# Patient Record
Sex: Male | Born: 1995 | Race: Black or African American | Hispanic: Yes | Marital: Single | Smoking: Never smoker
Health system: Southern US, Community
[De-identification: ages and names within clinical notes are randomized; demographics above are authoritative.]

---

## 2020-07-10 ENCOUNTER — Other Ambulatory Visit: Payer: Self-pay

## 2020-07-10 ENCOUNTER — Ambulatory Visit
Admission: EM | Admit: 2020-07-10 | Discharge: 2020-07-10 | Disposition: A | Discharge status: Home / Self Care | Payer: Self-pay | Attending: Internal Medicine | Admitting: Internal Medicine

## 2020-07-10 ENCOUNTER — Encounter: Payer: Self-pay | Admitting: Emergency Medicine

## 2020-07-10 DIAGNOSIS — R300 Dysuria: Secondary | ICD-10-CM | POA: Insufficient documentation

## 2020-07-10 LAB — URINALYSIS, COMPLETE (UACMP) WITH MICROSCOPIC
Bacteria, UA: NONE SEEN
Bilirubin Urine: NEGATIVE
Glucose, UA: NEGATIVE mg/dL
Hgb urine dipstick: NEGATIVE
Ketones, ur: NEGATIVE mg/dL
Leukocytes,Ua: NEGATIVE
Nitrite: NEGATIVE
Protein, ur: NEGATIVE mg/dL
Specific Gravity, Urine: 1.02 (ref 1.005–1.030)
Squamous Epithelial / HPF: NONE SEEN (ref 0–5)
WBC, UA: NONE SEEN WBC/hpf (ref 0–5)
pH: 7.5 (ref 5.0–8.0)

## 2020-07-10 LAB — CHLAMYDIA/NGC RT PCR (ARMC ONLY)
Chlamydia Tr: NOT DETECTED
N gonorrhoeae: NOT DETECTED

## 2020-07-10 MED ORDER — CEFTRIAXONE SODIUM 500 MG IJ SOLR
500.0000 mg | Freq: Once | INTRAMUSCULAR | Status: AC
  Administered 2020-07-10: 500 mg via INTRAMUSCULAR

## 2020-07-10 MED ORDER — AZITHROMYCIN 500 MG PO TABS
1000.0000 mg | ORAL_TABLET | Freq: Every day | ORAL | Status: AC
  Administered 2020-07-10: 1000 mg via ORAL

## 2020-07-10 NOTE — Discharge Instructions (Signed)
STD TESTING: Routine cultures obtained.  You have been treated for gonorrhea and chlamydia in the clinic today.  You can access your results through MyChart.  They should be back later today. Stressed to avoid sexual activity until all tests are negative

## 2020-07-10 NOTE — ED Provider Notes (Signed)
[facility]    CSN: [id number] Arrival date & time: 07/10/20  1056      History   Chief Complaint Chief Complaint  Patient presents with  . Dysuria    HPI Angel Sanders is a 24 y.o. male.   Patient presents for dysuria off and on for the past 6 days.  He denies any other symptoms.  He denies fever, urethral discharge, testicular pain or swelling, rashes or skin lesions, pelvic pain abdominal pain, or back pain.  He denies any known exposure to any STIs.  He states he would like to be tested and treated for STIs today.  No other concerns.  HPI  History reviewed. No pertinent past medical history.  There are no problems to display for this patient.   History reviewed. No pertinent surgical history.     Home Medications    Prior to Admission medications   Not on File    Family History Family History  Problem Relation Age of Onset  . Healthy Mother     Social History Social History   Tobacco Use  . Smoking status: Never Smoker  . Smokeless tobacco: Never Used  Vaping Use  . Vaping Use: Never used  Substance Use Topics  . Alcohol use: Not Currently  . Drug use: Never     Allergies   Patient has no known allergies.   Review of Systems Review of Systems  Constitutional: Negative for fatigue and fever.  Gastrointestinal: Negative for abdominal pain, nausea and vomiting.  Genitourinary: Positive for dysuria. Negative for difficulty urinating, discharge, frequency, scrotal swelling and testicular pain.  Musculoskeletal: Negative for back pain.  Skin: Negative for rash.  Neurological: Negative for weakness.     Physical Exam Triage Vital Signs ED Triage Vitals  Enc Vitals Group     BP 07/10/20 1138 124/65     Pulse Rate 07/10/20 1138 75     Resp 07/10/20 1138 16     Temp 07/10/20 1138 98.2 F (36.8 C)     Temp Source 07/10/20 1138 Oral     SpO2 07/10/20 1138 100 %     Weight 07/10/20 1135 185 lb (83.9 kg)     Height 07/10/20  1135 6\' 4"  (1.93 m)     Head Circumference --      Peak Flow --      Pain Score 07/10/20 1135 0     Pain Loc --      Pain Edu? --      Excl. in GC? --    No data found.  Updated Vital Signs BP 124/65 (BP Location: Right Arm)   Pulse 75   Temp 98.2 F (36.8 C) (Oral)   Resp 16   Ht 6\' 4"  (1.93 m)   Wt 185 lb (83.9 kg)   SpO2 100%   BMI 22.52 kg/m       Physical Exam Vitals and nursing note reviewed.  Constitutional:      General: He is not in acute distress.    Appearance: Normal appearance. He is well-developed. He is not ill-appearing or toxic-appearing.  HENT:     Head: Normocephalic and atraumatic.  Eyes:     General: No scleral icterus.    Extraocular Movements: Extraocular movements intact.     Conjunctiva/sclera: Conjunctivae normal.     Pupils: Pupils are equal, round, and reactive to light.  Cardiovascular:     Rate and Rhythm: Normal rate and regular rhythm.     Heart sounds:  Normal heart sounds. No murmur heard.   Pulmonary:     Effort: Pulmonary effort is normal. No respiratory distress.     Breath sounds: Normal breath sounds.  Abdominal:     General: Bowel sounds are normal.     Palpations: Abdomen is soft.     Tenderness: There is no abdominal tenderness. There is no right CVA tenderness or left CVA tenderness.  Musculoskeletal:        General: No swelling, tenderness, deformity or signs of injury. Normal range of motion.     Cervical back: Normal range of motion and neck supple.  Skin:    General: Skin is warm and dry.     Findings: No rash.  Neurological:     General: No focal deficit present.     Mental Status: He is alert. Mental status is at baseline.     Motor: No weakness.     Coordination: Coordination normal.     Gait: Gait normal.  Psychiatric:        Mood and Affect: Mood normal.        Behavior: Behavior normal.        Thought Content: Thought content normal.      UC Treatments / Results  Labs (all labs ordered are listed,  but only abnormal results are displayed) Labs Reviewed  CHLAMYDIA/NGC RT PCR ([facility] ONLY)  URINALYSIS, COMPLETE ([facility]) WITH MICROSCOPIC    EKG   Radiology No results found.  Procedures Procedures (including critical care time)  Medications Ordered in UC Medications  cefTRIAXone (ROCEPHIN) injection 500 mg (has no administration in time range)  azithromycin (ZITHROMAX) tablet 1,000 mg (has no administration in time range)    Initial Impression / Assessment and Plan / UC Course  I have reviewed the triage vital signs and the nursing notes.  Pertinent labs & imaging results that were available during my care of the patient were reviewed by me and considered in my medical decision making (see chart for details).   24 year old male presenting for dysuria off and on for 6 days.  Urinalysis is normal.  Testing sent for gonorrhea and chlamydia.  Patient wanted to be treated for gonorrhea chlamydia.  Patient given 500 mg IM Rocephin in clinic as well as 1 g azithromycin p.o.  Discussed with patient had axis test results.  Advised to abstain from intercourse until testing has been received.  He should not have any intercourse for a week after treatment for STIs.  Follow-up as needed.  Final Clinical Impressions(s) / UC Diagnoses   Final diagnoses:  Dysuria   Discharge Instructions   None    ED Prescriptions    None     PDMP not reviewed this encounter.   Shirlee Latch, PA-C 07/10/20 1301

## 2020-07-10 NOTE — ED Triage Notes (Signed)
Patient c/o burning when urinating that started on Saturday.  Patient denies penile discharge.

## 2020-07-21 ENCOUNTER — Other Ambulatory Visit: Payer: Self-pay

## 2020-07-21 ENCOUNTER — Ambulatory Visit: Payer: BC Managed Care – PPO | Attending: Sports Medicine | Admitting: Physical Therapy

## 2020-07-21 ENCOUNTER — Encounter: Payer: Self-pay | Admitting: Physical Therapy

## 2020-07-21 DIAGNOSIS — M6281 Muscle weakness (generalized): Secondary | ICD-10-CM | POA: Insufficient documentation

## 2020-07-21 DIAGNOSIS — M25572 Pain in left ankle and joints of left foot: Secondary | ICD-10-CM | POA: Insufficient documentation

## 2020-07-21 NOTE — Therapy (Signed)
[facility] [address] [place], [place], [address] Phone: [phone]   Fax:  [phone]  Physical Therapy Evaluation  Patient Details  Name: UNDRAY ALLMAN MRN: [id number] Date of Birth: [birth year] Referring Provider (PT): Landry Mellow, Mervyn Skeeters   Encounter Date: 07/21/2020   PT End of Session - 07/21/20 0924    Visit Number 1    Number of Visits 9    Date for PT Re-Evaluation 08/18/20    PT Start Time 0805    PT Stop Time 0900    PT Time Calculation (min) 55 min    Activity Tolerance Patient tolerated treatment well    Behavior During Therapy [facility] for tasks assessed/performed           History reviewed. No pertinent past medical history.  History reviewed. No pertinent surgical history.  There were no vitals filed for this visit.      SUBJECTIVE Chief complaint: Patient feels pain along the posterior tibial tendon and the pain radiates up to the medial knee. 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 Patient notes that over the course of training this summer he had AM stiffness and soreness, but not to the level that it is now. Patient notes the pain increased to a high intensity about 3 weeks ago. Patient notes initially he was limping but after a few rest days he was able to walk without limping. Patient has been able to bike for cardio without pain/issue. Patient reports some level of fear with retrnign to running as he does not trust his L ankle.  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 No ecchymosis, erythema, or edema noted. No gross ankle/foot deformity noted  Lumbar/Hip/Knee Screen AROM: WFL and painless with overpressure in all planes  Posture No gross abnormalities noted in seated or standing posture.  Gait Decreased control of L foot during initial contact to stance phase. Audible foot contact on L.   Palpation Mild tenderness along the posterior tibial pathway at the medial malleolus. L achilles tendon substantially thicker than R commensurate with dominant jumping leg.  Strength R/L 5/5 Hip flexion 5/5 Hip external rotation 5/5 Hip internal rotation 5/5 Hip extension  4/5 Hip abduction 5/5 Hip adduction 5/5 Knee extension 5/5 Knee flexion 5/5 Ankle Plantarflexion (seated) 5/5 Ankle Dorsiflexion 5/4* Ankle Inversion 5/4 Ankle Eversion  PROM R/L B WNL Ankle Plantarflexion B WNL Ankle Dorsiflexion B WNL Ankle Inversion B WNL Ankle Eversion  Passive Accessory Motion Superior Tibiofibular Joint: WNL, B Inferior Tibiofibular Joint: WNL, B Talocrural Joint Distraction: WNL, B Talocrural Joint AP: WNL, B Talocrural Joint PA: WNL, B  NEUROLOGICAL:  Mental Status Patient is oriented to person, place and time.  Recent memory is intact.  Remote memory is intact.  Attention span  and concentration are intact.  Expressive speech is intact.  Patient's fund of knowledge is within normal limits for educational level.  Sensation Grossly intact to light touch bilateral LEs as determined by testing dermatomes L2-S2 Proprioception and hot/cold testing  deferred on this date   SPECIAL TESTS  Ligamentous Integrity Anterior Drawer (ATF, 10-15 plantarflexion with anterior translation): Negative Talar Tilt (CFL, inversion): Negative Eversion Stress Test (Deltoid, eversion): Negative External Rotation Test (High ankle, dorsiflexion and external rotation): Negative Impingment Sign (Dorsiflexion and eversion): Negative  Achilles Integrity Thompson Test: Negative  Other Windlass Mechanism Test: Negative   ASSESSMENT Patient is a 24 year old presenting to clinic with chief complaints of L medial ankle pain. Upon examination, patient demonstrates deficits in R hip strength, L ankle strength, pain, and function as evidenced by inability to participate fully in sporting activity, R hip abduction 4/5 with hip flexor compensations, and L ankle inversion and eversion 4/5, as well as painful (6/10 worst in past week). Patient's responses on FOTO outcome measures (63) indicate moderate/significant functional limitations/disability/distress. Patient's progress may be limited due to physically demanding nature of occupation; however, patient's motivation is advantageous. Patient was able to achieve basic understanding of rehabilitation process during today's evaluation and responded positively to educational interventions. Patient will benefit from continued skilled therapeutic intervention to address deficits in R hip strength, L ankle strength, pain, and function in order to increase function and improve overall QOL.    EDUCATION Patient educated on prognosis, POC, and provided with HEP including: leg controlled articular rotations. Patient articulated understanding and returned demonstration. Patient will benefit from further education in order to maximize compliance and understanding for long-term therapeutic gains.      Objective measurements completed on examination: See above findings.         PT Long Term Goals - 07/21/20 4097      PT LONG  TERM GOAL #1   Title Patient will demonstrate improved function as evidenced by a score of 86 on FOTO measure for full participation in activities at home and in the community.    Baseline IE: 63    Time 4    Period Weeks    Status New    Target Date 08/18/20      PT LONG TERM GOAL #2   Title Patient will decrease worst pain as reported on NPRS by at least 3 points in order to demonstrate clinically significant reduction in ankle/foot pain.    Baseline IE: 6/10    Time 4    Period Weeks    Status New    Target Date 08/18/20      PT LONG TERM GOAL #3   Title Patient will be able to perform 25 repetitions of heel raise on LLE with pain controlled (</= 2/10) to demonstrate 5/5 plantarflexion functional strength.    Baseline IE: 5/5 seated plantarflexion strength    Time 4    Period Weeks    Status New    Target Date 08/18/20      PT LONG TERM GOAL #4   Title Patient will be able to perform the [facility] Drill/Test in under 11.5 seconds to demonstrate return to PLOF and competitive athletic standing.    Baseline IE: not demonstrated    Time 4    Period Weeks    Status New    Target Date 08/18/20      PT LONG TERM GOAL #5   Title Patient will be able to perform a 75 foot sprint  in under 3.5 seconds to demonstrate return to PLOF and competitive athletic standing.    Baseline IE: not demonstrated    Time 4    Period Weeks    Status New    Target Date 08/18/20                  Plan - 07/21/20 0924    Clinical Impression Statement Patient is a 24 year old presenting to clinic with chief complaints of L medial ankle pain. Upon examination, patient demonstrates deficits in R hip strength, L ankle strength, pain, and function as evidenced by inability to participate fully in sporting activity, R hip abduction 4/5 with hip flexor compensations, and L ankle inversion and eversion 4/5, as well as painful (6/10 worst in past week). Patient's responses on FOTO outcome measures  (63) indicate moderate/significant functional limitations/disability/distress. Patient's progress may be limited due to physically demanding nature of occupation; however, patient's motivation is advantageous. Patient was able to achieve basic understanding of rehabilitation process during today's evaluation and responded positively to educational interventions. Patient will benefit from continued skilled therapeutic intervention to address deficits in R hip strength, L ankle strength, pain, and function in order to increase function and improve overall QOL.    Personal Factors and Comorbidities Age;Fitness;Time since onset of injury/illness/exacerbation    Examination-Activity Limitations Locomotion Level;Other    Examination-Participation Restrictions Other;Occupation    Stability/Clinical Decision Making Stable/Uncomplicated    Clinical Decision Making Low    Rehab Potential Good    PT Frequency 2x / week    PT Duration 4 weeks    PT Treatment/Interventions Moist Heat;Cryotherapy;Electrical Stimulation;Therapeutic exercise;Therapeutic activities;Neuromuscular re-education;Patient/family education;Manual techniques;Passive range of motion;Dry needling;Spinal Manipulations;Joint Manipulations;Taping    PT Next Visit Plan Hip strengthening and eccentric ankle strengthening as tolerated    PT Home Exercise Plan Leg controlled articular rotation program    Consulted and Agree with Plan of Care Patient           Patient will benefit from skilled therapeutic intervention in order to improve the following deficits and impairments:  Pain, Improper body mechanics, Decreased strength, Decreased activity tolerance, Abnormal gait  Visit Diagnosis: Pain in left ankle and joints of left foot  Muscle weakness (generalized)     Problem List There are no problems to display for this patient.  Sheria Lang PT, DPT [id number]  07/21/2020, 12:50 PM  [facility] [address] [place], [place], [address] Phone: [phone]   Fax:  [phone]  Name: KITT LEDET MRN: [id number] Date of Birth: 08/23/1996

## 2020-07-23 ENCOUNTER — Encounter: Payer: BC Managed Care – PPO | Admitting: Physical Therapy

## 2020-07-28 ENCOUNTER — Encounter: Payer: Self-pay | Admitting: Physical Therapy

## 2020-07-28 ENCOUNTER — Ambulatory Visit: Payer: BC Managed Care – PPO | Attending: Sports Medicine | Admitting: Physical Therapy

## 2020-07-28 ENCOUNTER — Other Ambulatory Visit: Payer: Self-pay

## 2020-07-28 DIAGNOSIS — M6281 Muscle weakness (generalized): Secondary | ICD-10-CM | POA: Diagnosis present

## 2020-07-28 DIAGNOSIS — M25572 Pain in left ankle and joints of left foot: Secondary | ICD-10-CM | POA: Diagnosis present

## 2020-07-28 NOTE — Therapy (Signed)
[facility] [address] [place], [place], [address] Phone: [phone]   Fax:  [phone]  Physical Therapy Treatment  Patient Details  Name: Angel Sanders MRN: [id number] Date of Birth: 1996-04-14 Referring Provider (PT): Landry Mellow, Mervyn Skeeters   Encounter Date: 07/28/2020   PT End of Session - 07/28/20 1446    Visit Number 2    Number of Visits 9    Date for PT Re-Evaluation 08/18/20    PT Start Time 0900    PT Stop Time 0955    PT Time Calculation (min) 55 min    Activity Tolerance Patient tolerated treatment well    Behavior During Therapy [facility] for tasks assessed/performed           History reviewed. No pertinent past medical history.  History reviewed. No pertinent surgical history.  There were no vitals filed for this visit.   Subjective Assessment - 07/28/20 1456    Subjective Patient notes that he has done his controlled articular rotations and has done lower body workouts without complaint/pain. Patient adds that he also ran up stairs in his home without thinking and had no ankle pain/discomfort.    Currently in Pain? No/denies            TREATMENT  Neuromuscular Re-education: Hypervolt massage to L calf and hamstring complexes for improved proprioception Trampoline bounces for ankle stability and proprioception x 1 min LLE star reaches, 2x10 for improved ankle stability and proprioception Lateral step down, x15, B for improved LE motor control and proprioception Defensive slide slow pace for graded exposure to lateral movement Defensive slide at <70% effort for graded exposure to explosive lateral movement  Therapeutic Exercise: Seated elliptical, x5 min, L8, seat 18 for LE warm-up B calf raise isometrics, 10 sec x10 L calf raises, concentric, x15, x10 R calf raises, concentric, x25 B calf raise, eccentric lower, with ball block at ankle for neutral alignment, 1:3, x15   Patient educated throughout session  on appropriate technique and form using multi-modal cueing, HEP, and activity modification. Patient articulated understanding and returned demonstration.  Patient Response to interventions: Denies increased pain; notes some foot cramping with increased loads  ASSESSMENT Patient presents to clinic with excellent motivation to participate in therapy. Patient demonstrates deficits in R hip strength, L ankle strength, pain, and function. Patient able to achieve increased L ankle loading with isometric, concentric, and eccentric contractions during today's session and responded positively to active interventions. Patient will benefit from continued skilled therapeutic intervention to address remaining deficits in R hip strength, L ankle strength, pain, and function in order to increase function and improve overall QOL.     PT Long Term Goals - 07/21/20 5102      PT LONG TERM GOAL #1   Title Patient will demonstrate improved function as evidenced by a score of 86 on FOTO measure for full participation in activities at home and in the community.    Baseline IE: 63    Time 4    Period Weeks    Status New    Target Date 08/18/20      PT LONG TERM GOAL #2   Title Patient will decrease worst pain as reported on NPRS by at least 3 points in order to demonstrate clinically significant reduction in ankle/foot pain.    Baseline IE: 6/10    Time 4    Period Weeks    Status New    Target Date 08/18/20  PT LONG TERM GOAL #3   Title Patient will be able to perform 25 repetitions of heel raise on LLE with pain controlled (</= 2/10) to demonstrate 5/5 plantarflexion functional strength.    Baseline IE: 5/5 seated plantarflexion strength    Time 4    Period Weeks    Status New    Target Date 08/18/20      PT LONG TERM GOAL #4   Title Patient will be able to perform the [facility] Drill/Test in under 11.5 seconds to demonstrate return to PLOF and competitive athletic standing.    Baseline IE:  not demonstrated    Time 4    Period Weeks    Status New    Target Date 08/18/20      PT LONG TERM GOAL #5   Title Patient will be able to perform a 75 foot sprint in under 3.5 seconds to demonstrate return to PLOF and competitive athletic standing.    Baseline IE: not demonstrated    Time 4    Period Weeks    Status New    Target Date 08/18/20                 Plan - 07/28/20 1446    Clinical Impression Statement Patient presents to clinic with excellent motivation to participate in therapy. Patient demonstrates deficits in R hip strength, L ankle strength, pain, and function. Patient able to achieve increased L ankle loading with isometric, concentric, and eccentric contractions during today's session and responded positively to active interventions. Patient will benefit from continued skilled therapeutic intervention to address remaining deficits in R hip strength, L ankle strength, pain, and function in order to increase function and improve overall QOL.    Personal Factors and Comorbidities Age;Fitness;Time since onset of injury/illness/exacerbation    Examination-Activity Limitations Locomotion Level;Other    Examination-Participation Restrictions Other;Occupation    Stability/Clinical Decision Making Stable/Uncomplicated    Rehab Potential Good    PT Frequency 2x / week    PT Duration 4 weeks    PT Treatment/Interventions Moist Heat;Cryotherapy;Electrical Stimulation;Therapeutic exercise;Therapeutic activities;Neuromuscular re-education;Patient/family education;Manual techniques;Passive range of motion;Dry needling;Spinal Manipulations;Joint Manipulations;Taping    PT Next Visit Plan Hip strengthening and eccentric ankle strengthening as tolerated    PT Home Exercise Plan Leg controlled articular rotation program    Consulted and Agree with Plan of Care Patient           Patient will benefit from skilled therapeutic intervention in order to improve the following  deficits and impairments:  Pain, Improper body mechanics, Decreased strength, Decreased activity tolerance, Abnormal gait  Visit Diagnosis: Pain in left ankle and joints of left foot  Muscle weakness (generalized)     Problem List There are no problems to display for this patient.  Sheria Lang PT, DPT [id number]  07/28/2020, 2:58 PM  [facility] [address] [place], [place], [address] Phone: [phone]   Fax:  [phone]  Name: Angel Sanders MRN: [id number] Date of Birth: April 11, 1996

## 2020-08-04 ENCOUNTER — Encounter: Payer: BC Managed Care – PPO | Admitting: Physical Therapy

## 2020-08-06 ENCOUNTER — Encounter: Payer: BC Managed Care – PPO | Admitting: Physical Therapy

## 2020-08-11 ENCOUNTER — Encounter: Payer: BC Managed Care – PPO | Admitting: Physical Therapy

## 2020-08-11 ENCOUNTER — Ambulatory Visit: Payer: BC Managed Care – PPO | Admitting: Physical Therapy

## 2020-08-13 ENCOUNTER — Encounter: Payer: BC Managed Care – PPO | Admitting: Physical Therapy

## 2020-08-18 ENCOUNTER — Encounter: Payer: BC Managed Care – PPO | Admitting: Physical Therapy

## 2020-08-20 ENCOUNTER — Encounter: Payer: BC Managed Care – PPO | Admitting: Physical Therapy

## 2021-04-06 ENCOUNTER — Ambulatory Visit (INDEPENDENT_AMBULATORY_CARE_PROVIDER_SITE_OTHER): Payer: BC Managed Care – PPO

## 2021-04-06 ENCOUNTER — Encounter: Payer: Self-pay | Admitting: Emergency Medicine

## 2021-04-06 ENCOUNTER — Other Ambulatory Visit: Payer: Self-pay

## 2021-04-06 ENCOUNTER — Ambulatory Visit
Admission: EM | Admit: 2021-04-06 | Discharge: 2021-04-06 | Disposition: A | Discharge status: Home / Self Care | Payer: BC Managed Care – PPO | Attending: Sports Medicine | Admitting: Sports Medicine

## 2021-04-06 DIAGNOSIS — S6991XA Unspecified injury of right wrist, hand and finger(s), initial encounter: Secondary | ICD-10-CM

## 2021-04-06 DIAGNOSIS — S63501A Unspecified sprain of right wrist, initial encounter: Secondary | ICD-10-CM

## 2021-04-06 DIAGNOSIS — M25531 Pain in right wrist: Secondary | ICD-10-CM

## 2021-04-06 NOTE — ED Triage Notes (Signed)
Patient was playing basketball 3 weeks ago, landed on right wrist.  Patient has had pain since incident and pain is not improving.  This is the first visti for wrist pain .  Pain is in right wrist and slight swelling present.  Patient is able to move wrist, but pain is present.

## 2021-04-06 NOTE — ED Provider Notes (Signed)
[facility]    CSN: [id number] Arrival date & time: 04/06/21  1045      History   Chief Complaint Chief Complaint  Patient presents with   Wrist Pain    HPI Angel Sanders is a 25 y.o. male.   Patient is a 25 year old right-hand-dominant male who presents for evaluation of an injury to his right wrist.  Date of injury Mar 07, 2021.  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  He is unsure of the exact mechanism but but it appears as though he was diving for a loose ball and cutting it landed on.  It may well have been a FOOSH injury.  Since then he has been having pain on the dorsal aspect of the wrist to the radial aspect of the ulnar styloid bone.  Not quite in the middle of the wrist.  She has been complaining of tenderness directly over the carpal bones.  At the time of the injury there was no swelling or bruising.  He actually kept playing and it was during the playoffs and although he did receive a little bit of treatment and taping prior to the games from the trainer, it appears as though he did not seek out any definitive medical treatment by a physician.  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  No chronic problems with this wrist.  No numbness or tingling.  He denies any elbow shoulder or neck pain.   History reviewed. No pertinent past medical history.  There are no problems to display for this patient.   History reviewed. No pertinent surgical history.     Home Medications    Prior to Admission medications   Not on File    Family History Family History  Problem Relation Age of Onset   Healthy Mother     Social History Social History   Tobacco Use   Smoking status: Never   Smokeless tobacco: Never  Vaping Use   Vaping Use: Never used  Substance Use Topics   Alcohol use: Not Currently   Drug  use: Never     Allergies   Patient has no known allergies.   Review of Systems Review of Systems  Constitutional:  Positive for activity change. Negative for appetite change, chills, diaphoresis, fatigue and fever.  HENT:  Negative for congestion, ear pain, postnasal drip, rhinorrhea, sinus pressure, sinus pain, sneezing and sore throat.   Eyes:  Negative for pain.  Respiratory:  Negative for cough, chest tightness and shortness of breath.   Cardiovascular:  Negative for chest pain and palpitations.  Gastrointestinal:  Negative for abdominal pain, diarrhea, nausea and vomiting.  Genitourinary:  Negative for dysuria.  Musculoskeletal:  Positive for arthralgias. Negative for back pain, joint swelling, myalgias and neck pain.  Skin:  Negative for color change, pallor, rash and wound.  Neurological:  Negative for dizziness, light-headedness, numbness and headaches.  All other systems reviewed and are negative.   Physical Exam Triage Vital Signs ED Triage Vitals  Enc Vitals Group     BP 04/06/21 1125 (!) 160/77     Pulse Rate 04/06/21 1125 75     Resp 04/06/21 1125 18     Temp 04/06/21 1125 98 F (36.7 C)     Temp Source 04/06/21 1125 Oral     SpO2 04/06/21 1125 99 %  Weight --      Height --      Head Circumference --      Peak Flow --      Pain Score 04/06/21 1123 0     Pain Loc --      Pain Edu? --      Excl. in GC? --    No data found.  Updated Vital Signs BP (!) 160/77 (BP Location: Left Arm)   Pulse 75   Temp 98 F (36.7 C) (Oral)   Resp 18   SpO2 99%   Visual Acuity Right Eye Distance:   Left Eye Distance:   Bilateral Distance:    Right Eye Near:   Left Eye Near:    Bilateral Near:     Physical Exam Vitals and nursing note reviewed.  Constitutional:      General: He is not in acute distress.    Appearance: Normal appearance. He is not ill-appearing, toxic-appearing or diaphoretic.  HENT:     Head: Normocephalic and atraumatic.     Nose: Nose  normal.     Mouth/Throat:     Mouth: Mucous membranes are moist.  Eyes:     Conjunctiva/sclera: Conjunctivae normal.     Pupils: Pupils are equal, round, and reactive to light.  Cardiovascular:     Rate and Rhythm: Normal rate and regular rhythm.     Pulses: Normal pulses.     Heart sounds: Normal heart sounds. No murmur heard.   No friction rub. No gallop.  Pulmonary:     Effort: Pulmonary effort is normal.     Breath sounds: Normal breath sounds. No stridor. No wheezing, rhonchi or rales.  Musculoskeletal:     Right wrist: Tenderness and bony tenderness present. No swelling, deformity, effusion, lacerations, snuff box tenderness or crepitus. Decreased range of motion.     Left wrist: Normal.     Cervical back: Normal range of motion and neck supple.     Comments: No evidence of any significant laxity of the wrist.  No evidence of any scapholunate disassociation.  Skin:    General: Skin is warm and dry.     Capillary Refill: Capillary refill takes less than 2 seconds.  Neurological:     General: No focal deficit present.     Mental Status: He is alert and oriented to person, place, and time.     UC Treatments / Results  Labs (all labs ordered are listed, but only abnormal results are displayed) Labs Reviewed - No data to display  EKG   Radiology DG Wrist Complete Right  Result Date: 04/06/2021 CLINICAL DATA:  Landed on RIGHT wrist 3 weeks ago playing basketball, pain since incident, not improving EXAM: RIGHT WRIST - COMPLETE 3+ VIEW COMPARISON:  None FINDINGS: Osseous mineralization normal. Joint spaces preserved. No fracture, dislocation, or bone destruction. IMPRESSION: Normal exam. Electronically Signed   By: Ulyses Southward M.D.   On: 04/06/2021 11:46    Procedures Procedures (including critical care time)  Medications Ordered in UC Medications - No data to display  Initial Impression / Assessment and Plan / UC Course  I have reviewed the triage vital signs and the  nursing notes.  Pertinent labs & imaging results that were available during my care of the patient were reviewed by me and considered in my medical decision making (see chart for details).  Clinical impression: Right wrist injury, date of injury April 07, 2021.  He remains tender over the posterior carpal bones on the  ulnar aspect of his wrist.  No definite laxity or scapholunate disassociation appreciated on examination.  Treatment plan: 1.  The findings and treatment plan were discussed in detail with the patient.  Patient was in agreement. 2.  Recommended we get x-rays.  They were ordered and interpreted by myself in the office today.  My independent review indicated no fracture or dislocation.  No acute osseous findings.  We will await radiology overview and if different from my reading we will be in touch with the patient. 3.  Recommended sports medicine referral.  He wanted to go to [facility], and gave him the name of 2 physicians at [facility] sports medicine for him to see.  He may well need advanced imaging given his professional aspirations for a contract.  He may also need some hand occupational therapy assuming his advanced imaging is negative.  We will leave that to the discretion of the excepting physician. 4.  Educational handouts provided. 5.  Advised to modify his activity. 6.  Ice several times a day with over-the-counter meds as needed. 7.  He was discharged from care in stable condition and will follow-up here as needed.    Final Clinical Impressions(s) / UC Diagnoses   Final diagnoses:  Right wrist injury, initial encounter  Sprain of right wrist, initial encounter  Acute pain of right wrist     Discharge Instructions      As we discussed, your exam is consistent with a right wrist sprain with a superimposed bone bruise.  Cannot fully rule out a ligamentous injury in your wrist.  You are certainly point tender over several of the carpal bones in your wrist. My recommendation  would be to follow-up with [facility].  I gave you the number of 2 physicians there where you can call and get an appointment to be further evaluated.  You may need advanced imaging, occupational therapy to better treat your symptoms and get you ready for the upcoming basketball season. You can make that appointment on your own. For now my recommendation would be to avoid those activities that really flared this up.  Avoid weight lifting or basketball related activity until you have a definitive diagnosis.  You can certainly work on your fitness and cardiac endurance and can do lower body in the weight room until you are seen and evaluated by [facility].     ED Prescriptions   None    PDMP not reviewed this encounter.   Delton See, MD 04/06/21 1331

## 2021-04-06 NOTE — Discharge Instructions (Addendum)
As we discussed, your exam is consistent with a right wrist sprain with a superimposed bone bruise.  Cannot fully rule out a ligamentous injury in your wrist.  You are certainly point tender over several of the carpal bones in your wrist. My recommendation would be to follow-up with [facility].  I gave you the number of 2 physicians there where you can call and get an appointment to be further evaluated.  You may need advanced imaging, occupational therapy to better treat your symptoms and get you ready for the upcoming basketball season. You can make that appointment on your own. For now my recommendation would be to avoid those activities that really flared this up.  Avoid weight lifting or basketball related activity until you have a definitive diagnosis.  You can certainly work on your fitness and cardiac endurance and can do lower body in the weight room until you are seen and evaluated by [facility].

## 2022-08-12 IMAGING — CR DG WRIST COMPLETE 3+V*R*
4 series · 4 of 4 positions shown · non-contrast
Comparison: None

CLINICAL DATA: Landed on RIGHT wrist 3 weeks ago playing
basketball, pain since incident, not improving

EXAM:
RIGHT WRIST - COMPLETE 3+ VIEW

[wrist pa]
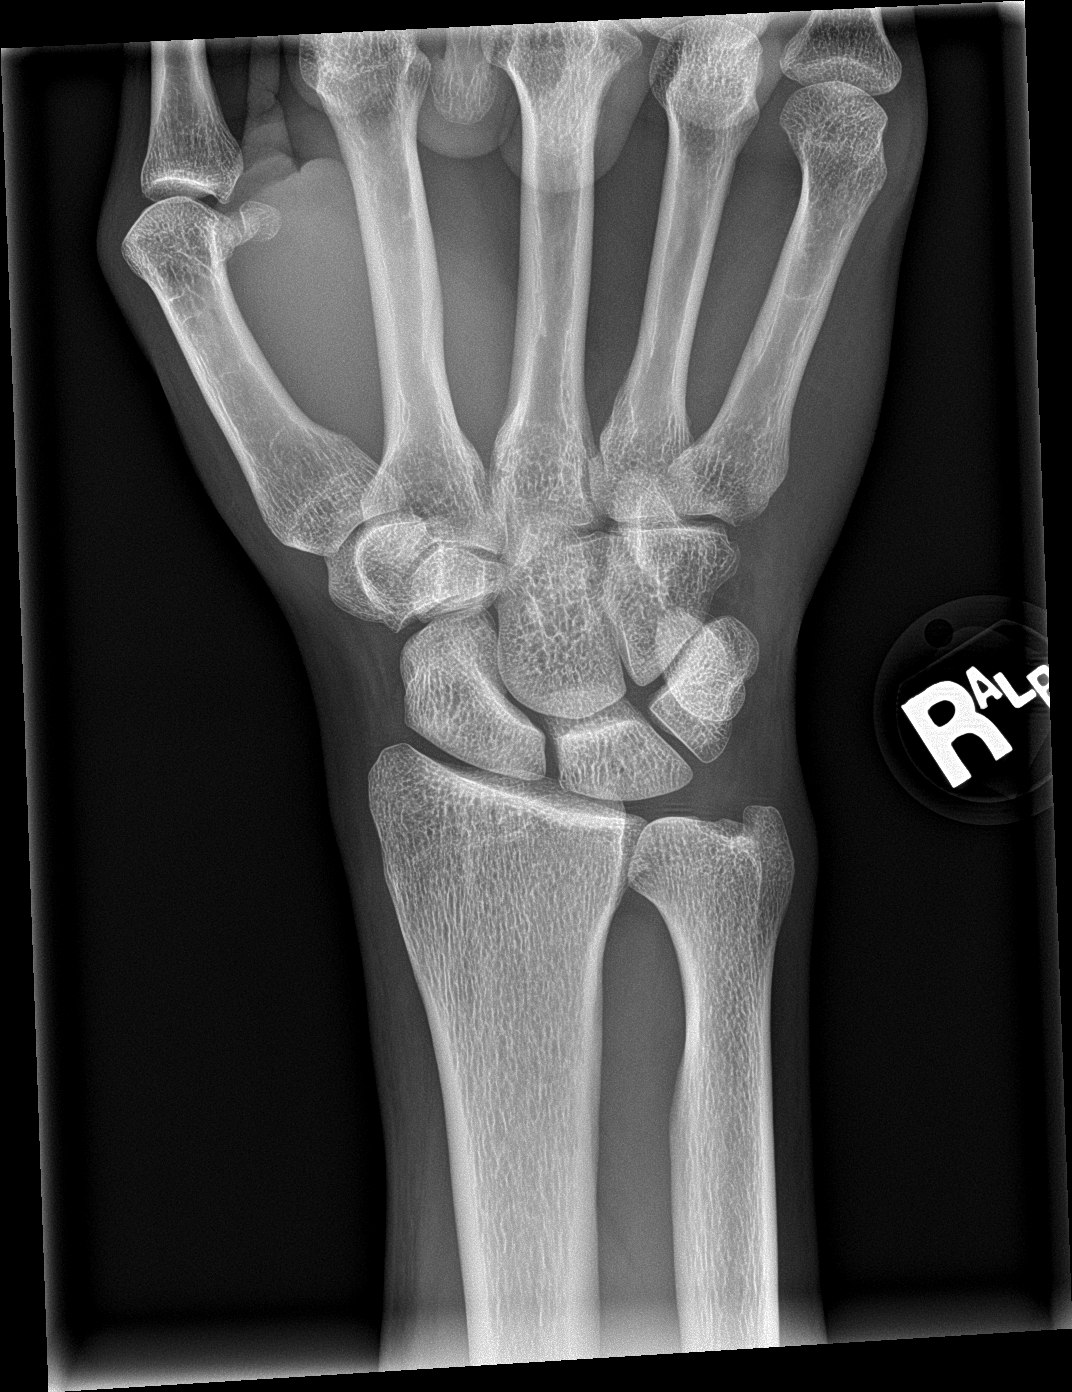

[wrist obl]
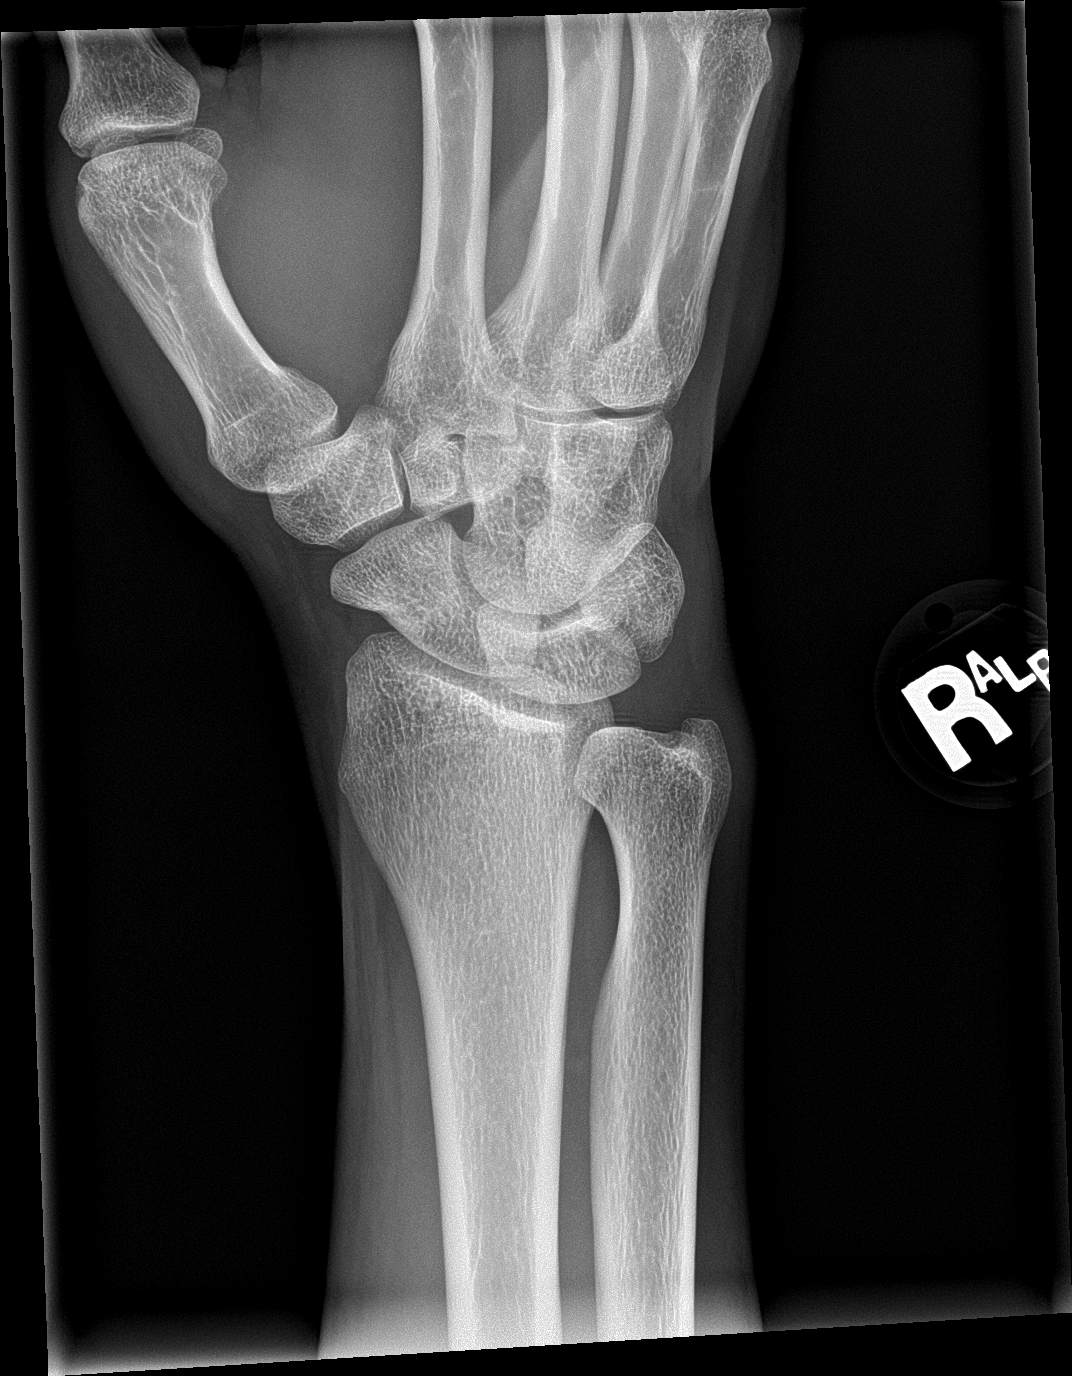

[wrist lat]
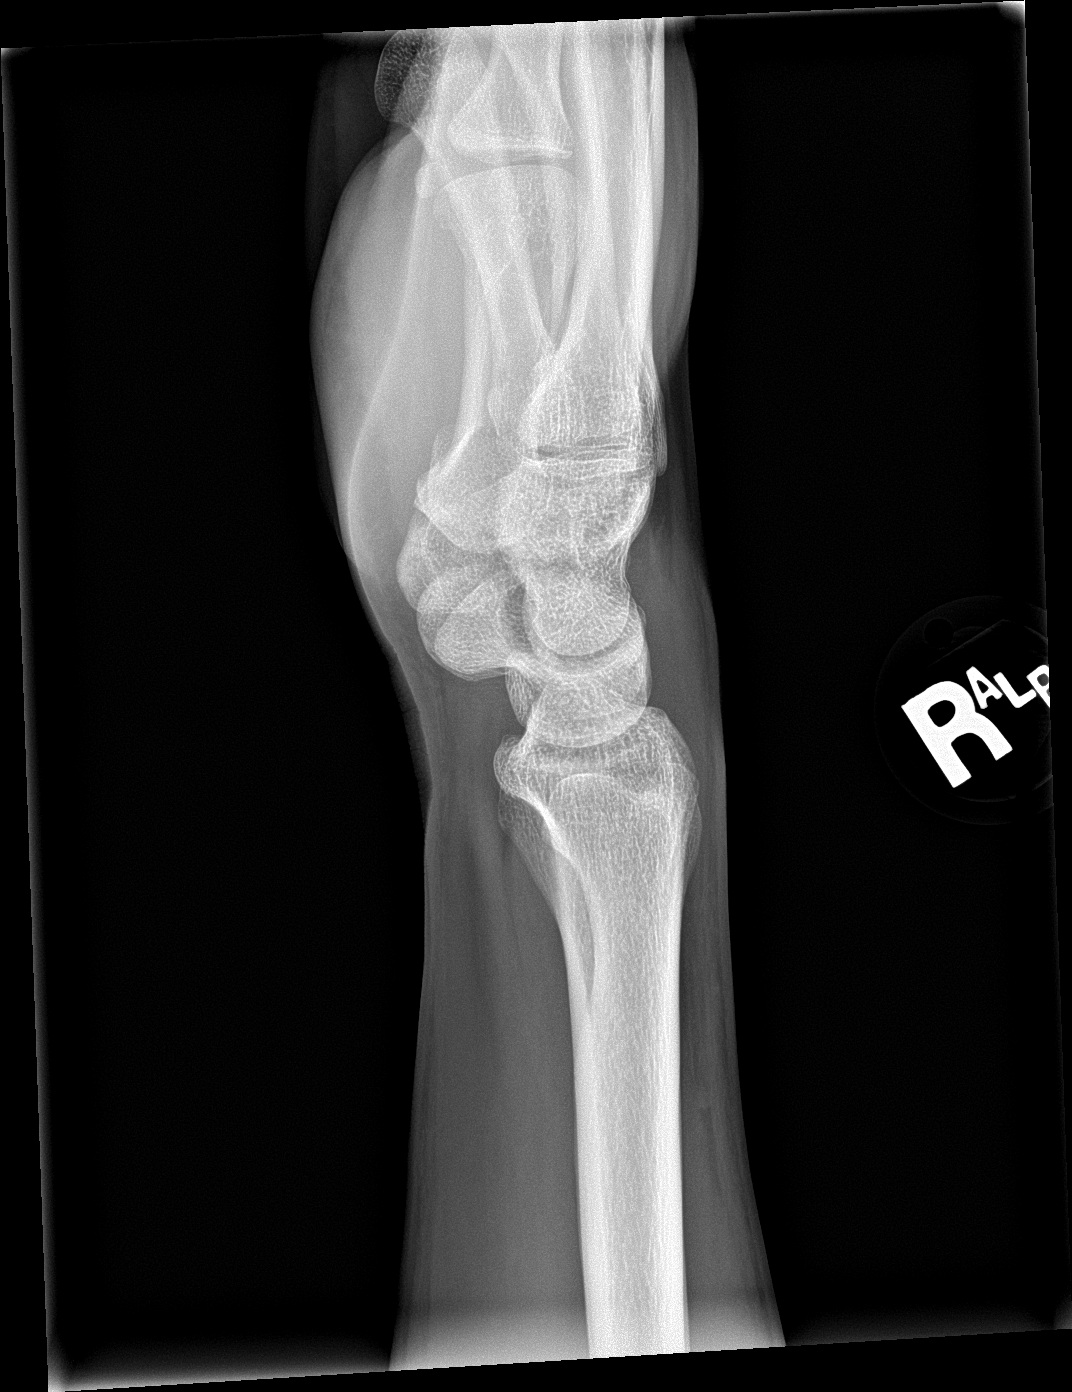

[wrist navicular]
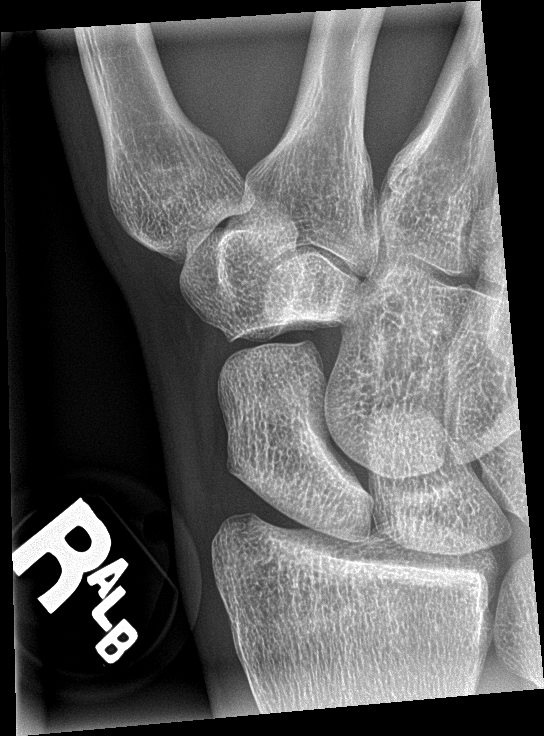

[4 of 4 positions shown; findings below may reference images not displayed]

FINDINGS: Osseous mineralization normal.

Joint spaces preserved.

No fracture, dislocation, or bone destruction.
IMPRESSION: Normal exam.

## 2024-10-14 ENCOUNTER — Ambulatory Visit
Admission: EM | Admit: 2024-10-14 | Discharge: 2024-10-14 | Disposition: A | Discharge status: Home / Self Care | Payer: Self-pay | Attending: Emergency Medicine | Admitting: Emergency Medicine

## 2024-10-14 DIAGNOSIS — L509 Urticaria, unspecified: Secondary | ICD-10-CM

## 2024-10-14 MED ORDER — CETIRIZINE HCL 10 MG PO TABS
10.0000 mg | ORAL_TABLET | Freq: Once | ORAL | Status: AC
  Administered 2024-10-14: 10 mg via ORAL

## 2024-10-14 MED ORDER — FAMOTIDINE 20 MG PO TABS
20.0000 mg | ORAL_TABLET | Freq: Once | ORAL | Status: AC
  Administered 2024-10-14: 20 mg via ORAL

## 2024-10-14 MED ORDER — DEXAMETHASONE SOD PHOSPHATE PF 10 MG/ML IJ SOLN
10.0000 mg | Freq: Once | INTRAMUSCULAR | Status: AC
  Administered 2024-10-14: 10 mg via INTRAMUSCULAR

## 2024-10-14 MED ORDER — PREDNISONE 10 MG (21) PO TBPK: ORAL_TABLET | ORAL | 0 refills | Status: AC

## 2024-10-14 NOTE — ED Triage Notes (Signed)
 Patient presents to UC for generalized rash x 2 days ago. States he noted it on face and spreading to legs. Not taking any OTC meds. States he was in [place].

## 2024-10-14 NOTE — ED Provider Notes (Signed)
 " [facility]    CSN: [id number] Arrival date & time: 10/14/24  1109      History   Chief Complaint Chief Complaint  Patient presents with   Rash    HPI Angel Sanders is a 28 y.o. male.   HPI  28 year old male with no significant past medical history presents for evaluation of a generalized rash that started 2 days ago.  He reports the rash started on his left cheek and then has spread to his legs.  No rash at present.  No complaints of lip or tongue swelling, throat tightness, or shortness of breath.  History reviewed. No pertinent past medical history.  There are no active problems to display for this patient.   History reviewed. No pertinent surgical history.     Home Medications    Prior to Admission medications  Medication Sig Start Date End Date Taking? Authorizing Provider  predniSONE  (STERAPRED UNI-PAK 21 TAB) 10 MG (21) TBPK tablet Take 6 tablets on day 1, 5 tablets day 2, 4 tablets day 3, 3 tablets day 4, 2 tablets day 5, 1 tablet day 6 10/14/24  Yes Bernardino Ditch, NP    Family History Family History  Problem Relation Age of Onset   Healthy Mother     Social History Social History[1]   Allergies   Patient has no known allergies.   Review of Systems Review of Systems  HENT:  Positive for facial swelling. Negative for trouble swallowing.   Respiratory:  Negative for shortness of breath, wheezing and stridor.   Skin:  Positive for rash.     Physical Exam Triage Vital Signs ED Triage Vitals  Encounter Vitals Group     BP      Girls Systolic BP Percentile      Girls Diastolic BP Percentile      Boys Systolic BP Percentile      Boys Diastolic BP Percentile      Pulse      Resp      Temp      Temp src      SpO2      Weight      Height      Head Circumference      Peak Flow      Pain Score      Pain Loc      Pain Education      Exclude from Growth Chart    No data found.  Updated Vital Signs BP 128/72 (BP  Location: Right Arm)   Pulse 71   Temp 97.7 F (36.5 C) (Oral)   Resp 16   SpO2 98%   Visual Acuity Right Eye Distance:   Left Eye Distance:   Bilateral Distance:    Right Eye Near:   Left Eye Near:    Bilateral Near:     Physical Exam Vitals and nursing note reviewed.  Constitutional:      Appearance: Normal appearance. He is not ill-appearing.  HENT:     Head: Normocephalic and atraumatic.  Skin:    General: Skin is warm and dry.     Capillary Refill: Capillary refill takes less than 2 seconds.     Findings: Erythema and rash present.  Neurological:     General: No focal deficit present.     Mental Status: He is alert and oriented to person, place, and time.      UC Treatments / Results  Labs (all labs ordered are listed, but only abnormal  results are displayed) Labs Reviewed - No data to display  EKG   Radiology No results found.  Procedures Procedures (including critical care time)  Medications Ordered in UC Medications  cetirizine  (ZYRTEC ) tablet 10 mg (has no administration in time range)  famotidine  (PEPCID ) tablet 20 mg (has no administration in time range)  dexamethasone  (DECADRON ) injection 10 mg (has no administration in time range)    Initial Impression / Assessment and Plan / UC Course  I have reviewed the triage vital signs and the nursing notes.  Pertinent labs & imaging results that were available during my care of the patient were reviewed by me and considered in my medical decision making (see chart for details).   Patient is a nontoxic-appearing 28 year old male presenting for evaluation of a body rash as outlined in HPI above.  He reports that the rash for started on his left cheek and he thought he may have been bitten by a mosquito but he did not feel a bite.  The rash went away and then on his return flight from [place] yesterday he noticed he had itching on his legs.  This morning when he woke up he had redness on his right thigh.   The rash is largely resolved at present.  As you can see in the image above, the patient has a small erythematous wheal on the superior medial aspect of the anterior right thigh which resembles a hive.  I will treat the patient for urticaria with 10 mg of IM Decadron  here in clinic and discharge him home on a 6-day prednisone  taper along with dual antihistamine therapy.  Will also administer 10 mg of Zyrtec  here in clinic along with 20 mg of p.o. Pepcid .   Final Clinical Impressions(s) / UC Diagnoses   Final diagnoses:  Urticaria     Discharge Instructions      Take over-the-counter Allegra 180 mg daily or Zyrtec  or Claritin 10 mg daily to help with your itching.  You can take over-the-counter Benadryl, 50 mg at bedtime, as needed for itching and sleep.  Take the prednisone  pack according to the package instructions.  You will taken on tapering dose over a period of 6 days.  Take it with food and always take it first in the morning with breakfast.  Take over-the-counter Pepcid  20 mg twice daily to help with itching as well.  If you develop any swelling of your lips or tongue, tightness in your throat, or difficulty breathing you need to go to the ER for evaluation.      ED Prescriptions     Medication Sig Dispense Auth. Provider   predniSONE  (STERAPRED UNI-PAK 21 TAB) 10 MG (21) TBPK tablet Take 6 tablets on day 1, 5 tablets day 2, 4 tablets day 3, 3 tablets day 4, 2 tablets day 5, 1 tablet day 6 21 tablet Bernardino Ditch, NP      PDMP not reviewed this encounter.    [1]  Social History Tobacco Use   Smoking status: Never   Smokeless tobacco: Never  Vaping Use   Vaping status: Never Used  Substance Use Topics   Alcohol use: Not Currently   Drug use: Never     Bernardino Ditch, NP 10/14/24 1301  "

## 2024-10-14 NOTE — Discharge Instructions (Addendum)
 Take over-the-counter Allegra 180 mg daily or Zyrtec or Claritin 10 mg daily to help with your itching.  You can take over-the-counter Benadryl, 50 mg at bedtime, as needed for itching and sleep.  Take the prednisone pack according to the package instructions.  You will taken on tapering dose over a period of 6 days.  Take it with food and always take it first in the morning with breakfast.  Take over-the-counter Pepcid 20 mg twice daily to help with itching as well.  If you develop any swelling of your lips or tongue, tightness in your throat, or difficulty breathing you need to go to the ER for evaluation.
